# Patient Record
Sex: Male | Born: 2000 | Race: White | Hispanic: No | Marital: Single
Health system: Southern US, Community
[De-identification: ages and names within clinical notes are randomized; demographics above are authoritative.]

---
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
## 2019-02-13 DIAGNOSIS — Z23 Encounter for immunization: Secondary | ICD-10-CM | POA: Diagnosis not present

## 2019-04-23 DIAGNOSIS — Z0000 Encounter for general adult medical examination without abnormal findings: Secondary | ICD-10-CM | POA: Diagnosis not present

## 2019-05-01 DIAGNOSIS — Z0000 Encounter for general adult medical examination without abnormal findings: Secondary | ICD-10-CM | POA: Diagnosis not present

## 2019-05-01 DIAGNOSIS — Z23 Encounter for immunization: Secondary | ICD-10-CM | POA: Diagnosis not present

## 2019-05-01 DIAGNOSIS — Z681 Body mass index (BMI) 19 or less, adult: Secondary | ICD-10-CM | POA: Diagnosis not present

## 2019-05-26 DIAGNOSIS — R030 Elevated blood-pressure reading, without diagnosis of hypertension: Secondary | ICD-10-CM | POA: Diagnosis not present

## 2019-05-26 DIAGNOSIS — Z733 Stress, not elsewhere classified: Secondary | ICD-10-CM | POA: Diagnosis not present

## 2020-03-13 ENCOUNTER — Other Ambulatory Visit: Payer: Self-pay

## 2020-03-13 ENCOUNTER — Ambulatory Visit: Payer: Self-pay | Attending: Internal Medicine

## 2020-03-13 DIAGNOSIS — Z23 Encounter for immunization: Secondary | ICD-10-CM

## 2020-03-13 NOTE — Progress Notes (Signed)
   Covid-19 Vaccination Clinic  Name:  Jonathan Atkins    MRN: [id number] DOB: 07-26-2000  03/13/2020  [name] was observed post Covid-19 immunization for 15 minutes without incident. He was provided with Vaccine Information Sheet and instruction to access the V-Safe system.   [name] was instructed to call 911 with any severe reactions post vaccine: Marland Kitchen Difficulty breathing  . Swelling of face and throat  . A fast heartbeat  . A bad rash all over body  . Dizziness and weakness
